# Patient Record
Sex: Male | Born: 1975 | Race: White | Hispanic: No | Marital: Single | Smoking: Never smoker
Health system: Southern US, Community
[De-identification: ages and names within clinical notes are randomized; demographics above are authoritative.]

---

## 2000-11-05 ENCOUNTER — Emergency Department (HOSPITAL_COMMUNITY): Admission: EM | Admit: 2000-11-05 | Discharge: 2000-11-05 | Payer: Self-pay | Admitting: Emergency Medicine

## 2000-11-05 ENCOUNTER — Encounter: Payer: Self-pay | Admitting: Emergency Medicine

## 2000-11-18 ENCOUNTER — Emergency Department (HOSPITAL_COMMUNITY): Admission: EM | Admit: 2000-11-18 | Discharge: 2000-11-18 | Payer: Self-pay

## 2005-01-03 ENCOUNTER — Emergency Department (HOSPITAL_COMMUNITY): Admission: EM | Admit: 2005-01-03 | Discharge: 2005-01-03 | Payer: Self-pay | Admitting: Emergency Medicine

## 2010-04-01 ENCOUNTER — Telehealth: Payer: Self-pay | Admitting: *Deleted

## 2010-12-28 NOTE — Progress Notes (Signed)
Summary: New pt No Show in Jan, calling to schedule again  ---- Converted from flag ---- ---- 03/31/2010 5:35 PM, Evelena Peat MD wrote:   ---- 03/31/2010 1:49 PM, Lucy Antigua wrote: I rcvd call from a Roger Norris dob [birth year]. Pt NOS for NP appt in January and now wants to sch ov. Pt has dry cough for about 3 wks and is getting progressively worse.  Please advise.    [phone] Roger Norris) ------------------------------  Phone Note Call from Patient   Caller: Roger Norris [phone] Call For: [place] Summary of Call: Per Dr Caryl Never, call pt to discuss reason for no-show in Jan, 2011.  Home phone on record [phone]  VM name was Gretchen Short?  Called number Asher Muir (fiance) left [phone] "not in service", not able to leave a message. Initial call taken by: Sid Falcon LPN,  Apr 01, 3328 9:24 AM

## 2014-07-29 ENCOUNTER — Ambulatory Visit (INDEPENDENT_AMBULATORY_CARE_PROVIDER_SITE_OTHER): Payer: Managed Care, Other (non HMO) | Admitting: Podiatrist

## 2014-07-29 ENCOUNTER — Encounter: Payer: Self-pay | Admitting: Podiatrist

## 2014-07-29 ENCOUNTER — Ambulatory Visit (INDEPENDENT_AMBULATORY_CARE_PROVIDER_SITE_OTHER): Payer: Managed Care, Other (non HMO)

## 2014-07-29 VITALS — BP 117/82 | HR 94 | Resp 18

## 2014-07-29 DIAGNOSIS — R52 Pain, unspecified: Secondary | ICD-10-CM

## 2014-07-29 DIAGNOSIS — M722 Plantar fascial fibromatosis: Secondary | ICD-10-CM

## 2014-07-29 NOTE — Progress Notes (Signed)
   Subjective:    Patient ID: Roger Norris, male    DOB: 1976-09-10, 38 y.o.   MRN: [id number]  HPI I AM HAVING SOME PAIN IN MY RIGHT HEEL AND HAS BEEN GOING ON FOR ABOUT 2 YEARS AND THERE IS NO BURNING AND THROBBING AND FEELS BRUISED AND IT HURTS IN THE HEEL AND HURTS IN THE MORNING AND HURTS AFTER I SIT AND I SOMETIMES ROLL MY ANKLE AND I WENT TO A FOOT DOCTOR A YEAR AGO AND HE GAVE TWO BRACES ONE FOR NIGHT TIME AND ONE TO WEAR DURING THE DAY AND WALKS ALL DAY AT WORK    Review of Systems  Musculoskeletal: Positive for gait problem.  All other systems reviewed and are negative.      Objective:   Physical Exam  GENERAL APPEARANCE: Alert, conversant. Appropriately groomed. No acute distress.  VASCULAR: Pedal pulses palpable and strong bilateral.  Capillary refill time is immediate to all digits,  Proximal to distal cooling it warm to warm.  Digital hair growth is present bilateral  NEUROLOGIC: sensation is intact epicritically and protectively to 5.07 monofilament at 5/5 sites bilateral.  Light touch is intact bilateral, vibratory sensation intact bilateral, achilles tendon reflex is intact bilateral.  Negative Tinel sign is elicited MUSCULOSKELETAL: Pain on palpation plantar medial aspect right foot  at insertion of plantar fascia on the medial calcaneal tubercle. Inflammation at the insertion of the plantar fascia is present. Rectus foot type is seen. DERMATOLOGIC: skin color, texture, and turger are within normal limits.  No preulcerative lesions are seen, no interdigital maceration noted.  No open lesions present.  Digital nails are asymptomatic.          Assessment & Plan:  Plantar fasciitis right foot  Plan:  The patient was disappointed with the relief he received from a previous injection from a previous doctor therefore he declined an injection at today's visit.  Recommended plantar fascial taping and an orthotic-- dispensed a power step insert today.  Recommended stretching  exercises and use of good supportive shoes.  He will be seen back in the future or if he reconsiders a steroid injection.

## 2014-07-29 NOTE — Patient Instructions (Signed)

## 2014-08-27 ENCOUNTER — Ambulatory Visit: Payer: Managed Care, Other (non HMO) | Admitting: Podiatrist

## 2017-10-26 ENCOUNTER — Ambulatory Visit (INDEPENDENT_AMBULATORY_CARE_PROVIDER_SITE_OTHER): Payer: Worker's Compensation | Admitting: Orthopedic Surgery

## 2017-10-26 ENCOUNTER — Ambulatory Visit (INDEPENDENT_AMBULATORY_CARE_PROVIDER_SITE_OTHER): Payer: Worker's Compensation

## 2017-10-26 ENCOUNTER — Other Ambulatory Visit (INDEPENDENT_AMBULATORY_CARE_PROVIDER_SITE_OTHER): Payer: Self-pay

## 2017-10-26 DIAGNOSIS — M25511 Pain in right shoulder: Secondary | ICD-10-CM

## 2017-10-26 DIAGNOSIS — G8929 Other chronic pain: Secondary | ICD-10-CM

## 2017-10-29 ENCOUNTER — Encounter (INDEPENDENT_AMBULATORY_CARE_PROVIDER_SITE_OTHER): Payer: Self-pay | Admitting: Orthopedic Surgery

## 2017-10-29 NOTE — Progress Notes (Signed)
Office Visit Note   Patient: Roger Norris           Date of Birth: 07-Apr-1976           MRN: [id number] Visit Date: 10/26/2017 Requested by: No referring provider defined for this encounter. PCP: Patient, No Pcp Per  Subjective: No chief complaint on file.   HPI: Roger Norris is a 41-year-old patient who injured his right shoulder states that he tried to keep battery from falling off the shelf caught it with his arm flexed.  Reported pain in the shoulder neck and elbow region since that time.  States that his strength and range of motion.  He has had MRI scan of the neck which shows reoccurred September 17, 2017.  He reports biceps pain as well.  Localizes the pain to the shoulder also.  Out of work since November 18.  Both of his head symptoms are worse.  He has an MRI report of cervical spine which does show some degenerative changes and disc bulging.  This may be part of his symptomatology.  He has not had imaging of his right shoulder.              ROS: All systems reviewed are negative as they relate to the chief complaint within the history of present illness.  Patient denies  fevers or chills.   Assessment & Plan: Visit Diagnoses:  1. Acute pain of right shoulder     Plan: Impression is right shoulder and neck pain.  Right elbow examination today demonstrates that the biceps tendon distally is intact.  His mechanism of injury is most consistent with biceps injury but functionally that appears to be intact today.  No paresthesias in the lateral antebrachial cutaneous nerve as well.  I think it is likely he has neck pathology which is giving him his whole arm symptoms and shoulder pathology must also be ruled out based on mechanism of injury.  Shoulder to evaluate the labrum and rotator cuff tear refer to Dr. Alvester MorinNewton for cervical spine ESI for diagnostic and therapeutic purposes.  Out of work for 4 weeks.  Follow-Up Instructions: Return for after MRI.   Orders:  Orders Placed This Encounter    Procedures  . XR Shoulder Right   No orders of the defined types were placed in this encounter.     Procedures: No procedures performed   Clinical Data: No additional findings.  Objective: Vital Signs: There were no vitals taken for this visit.  Physical Exam:   Constitutional: Patient appears well-developed HEENT:  Head: Normocephalic Eyes:EOM are normal Neck: Normal range of motion Cardiovascular: Normal rate Pulmonary/chest: Effort normal Neurologic: Patient is alert Skin: Skin is warm Psychiatric: Patient has normal mood and affect    Ortho Exam: Orthopedic exam demonstrates good cervical spine range of motion.  Patient has 5 out of 5 grip EPL FPL interosseous wrist flexion wrist extension biceps triceps and deltoid strength.  Rotator cuff strength also pretty reasonable to infraspinatus supraspinatus and subscap testing on the right compared to the left.  No discrete acromioclavicular joint tenderness on the right-hand side.  Negative apprehension and relocation testing on the right.  No definite paresthesias C5-T1.  Reflexes symmetric bilateral biceps and triceps.  Specialty Comments:  No specialty comments available.  Imaging: No results found.   PMFS History: There are no active problems to display for this patient.  No past medical history on file.  No family history on file.   Social History  Occupational History  . Not on file  Tobacco Use  . Smoking status: Never Smoker  . Smokeless tobacco: Never Used  Substance and Sexual Activity  . Alcohol use: Yes  . Drug use: No  . Sexual activity: Not on file

## 2017-11-02 ENCOUNTER — Telehealth (INDEPENDENT_AMBULATORY_CARE_PROVIDER_SITE_OTHER): Payer: Self-pay

## 2017-11-02 NOTE — Telephone Encounter (Signed)
Emailed case mgr the 10/25/17 work note per her request

## 2017-11-09 ENCOUNTER — Telehealth (INDEPENDENT_AMBULATORY_CARE_PROVIDER_SITE_OTHER): Payer: Self-pay | Admitting: *Deleted

## 2017-11-09 NOTE — Telephone Encounter (Signed)
I received a vm from [place] at [facility] stating needs order MRI right shoulder faxed to [phone] in order to get pt scheduled for MRI. I faxed over the order as requested.

## 2017-12-13 ENCOUNTER — Ambulatory Visit (INDEPENDENT_AMBULATORY_CARE_PROVIDER_SITE_OTHER): Payer: Self-pay | Admitting: Orthopedic Surgery

## 2017-12-15 ENCOUNTER — Ambulatory Visit (INDEPENDENT_AMBULATORY_CARE_PROVIDER_SITE_OTHER): Payer: Worker's Compensation | Admitting: Orthopedic Surgery

## 2017-12-15 ENCOUNTER — Encounter (INDEPENDENT_AMBULATORY_CARE_PROVIDER_SITE_OTHER): Payer: Self-pay | Admitting: Orthopedic Surgery

## 2017-12-15 DIAGNOSIS — M19011 Primary osteoarthritis, right shoulder: Secondary | ICD-10-CM | POA: Diagnosis not present

## 2017-12-15 DIAGNOSIS — M7551 Bursitis of right shoulder: Secondary | ICD-10-CM

## 2017-12-16 ENCOUNTER — Encounter (INDEPENDENT_AMBULATORY_CARE_PROVIDER_SITE_OTHER): Payer: Self-pay | Admitting: Orthopedic Surgery

## 2017-12-16 DIAGNOSIS — M19011 Primary osteoarthritis, right shoulder: Secondary | ICD-10-CM | POA: Diagnosis not present

## 2017-12-16 DIAGNOSIS — M7551 Bursitis of right shoulder: Secondary | ICD-10-CM | POA: Diagnosis not present

## 2017-12-16 MED ORDER — METHYLPREDNISOLONE ACETATE 40 MG/ML IJ SUSP
13.3300 mg | INTRAMUSCULAR | Status: AC | PRN
  Administered 2017-12-16: 13.33 mg via INTRA_ARTICULAR

## 2017-12-16 MED ORDER — BUPIVACAINE HCL 0.25 % IJ SOLN
0.6600 mL | INTRAMUSCULAR | Status: AC | PRN
  Administered 2017-12-16: .66 mL via INTRA_ARTICULAR

## 2017-12-16 MED ORDER — METHYLPREDNISOLONE ACETATE 40 MG/ML IJ SUSP
40.0000 mg | INTRAMUSCULAR | Status: AC | PRN
  Administered 2017-12-16: 40 mg via INTRA_ARTICULAR

## 2017-12-16 MED ORDER — LIDOCAINE HCL 1 % IJ SOLN
5.0000 mL | INTRAMUSCULAR | Status: AC | PRN
  Administered 2017-12-16: 5 mL

## 2017-12-16 MED ORDER — BUPIVACAINE HCL 0.5 % IJ SOLN
9.0000 mL | INTRAMUSCULAR | Status: AC | PRN
  Administered 2017-12-16: 9 mL via INTRA_ARTICULAR

## 2017-12-16 MED ORDER — LIDOCAINE HCL 1 % IJ SOLN
3.0000 mL | INTRAMUSCULAR | Status: AC | PRN
  Administered 2017-12-16: 3 mL

## 2017-12-16 NOTE — Progress Notes (Addendum)
Office Visit Note   Patient: Roger Norris           Date of Birth: 01-06-1976           MRN: [id number] Visit Date: 12/15/2017 Requested by: No referring provider defined for this encounter. PCP: Patient, No Pcp Per  Subjective: Chief Complaint  Patient presents with  . Right Shoulder - Follow-up    HPI: Roger Norris is a patient here with right shoulder pain for review of MRI scan.  MRI scan shows that the rotator cuff is intact.  The labrum also appears intact.  Bursitis and AC joint arthritis is present.  He reports continued symptoms.  Localizes the pain to the superior aspect of the shoulder and the deltoid.              ROS: All systems reviewed are negative as they relate to the chief complaint within the history of present illness.  Patient denies  fevers or chills.   Assessment & Plan: Visit Diagnoses:  1. Arthritis of right acromioclavicular joint     Plan: Impression is right shoulder pain with AC joint arthritis and bursitis.  Rotator cuff looks to be intact.  I would like to inject the [facility] joint today with ultrasound as well as inject the subacromial space.  3-week return for clinical reassessment.  Follow-Up Instructions: Return in about 3 weeks (around 01/05/2018).   Orders:  No orders of the defined types were placed in this encounter.  No orders of the defined types were placed in this encounter.     Procedures: Large Joint Inj: R subacromial bursa on 12/16/2017 9:26 AM Indications: diagnostic evaluation and pain Details: 18 G 1.5 in needle, posterior approach  Arthrogram: No  Medications: 9 mL bupivacaine 0.5 %; 40 mg methylPREDNISolone acetate 40 MG/ML; 5 mL lidocaine 1 % Outcome: tolerated well, no immediate complications Procedure, treatment alternatives, risks and benefits explained, specific risks discussed. Consent was given by the patient. Immediately prior to procedure a time out was called to verify the correct patient, procedure, equipment, support  staff and site/side marked as required. Patient was prepped and draped in the usual sterile fashion.   Medium Joint Inj on 12/16/2017 9:26 AM Indications: diagnostic evaluation and pain Details: 25 G 1.5 in needle, ultrasound-guided superior approach Medications: 3 mL lidocaine 1 %; 0.66 mL bupivacaine 0.25 %; 13.33 mg methylPREDNISolone acetate 40 MG/ML Outcome: tolerated well, no immediate complications Procedure, treatment alternatives, risks and benefits explained, specific risks discussed. Consent was given by the patient. Immediately prior to procedure a time out was called to verify the correct patient, procedure, equipment, support staff and site/side marked as required. Patient was prepped and draped in the usual sterile fashion.       Clinical Data: No additional findings.  Objective: Vital Signs: There were no vitals taken for this visit.  Physical Exam:   Constitutional: Patient appears well-developed HEENT:  Head: Normocephalic Eyes:EOM are normal Neck: Normal range of motion Cardiovascular: Normal rate Pulmonary/chest: Effort normal Neurologic: Patient is alert Skin: Skin is warm Psychiatric: Patient has normal mood and affect    Ortho Exam: Orthopedic exam demonstrates good cervical spine range of motion.  Does have mild AC joint tenderness right versus left.  Motor sensory function to the hand intact.  No restriction of external rotation at 15 degrees of abduction.  No other masses lymphadenopathy or skin changes noted in the right shoulder girdle region  Specialty Comments:  No specialty comments available.  Imaging: No  results found.   PMFS History: There are no active problems to display for this patient.  History reviewed. No pertinent past medical history.  History reviewed. No pertinent family history.  History reviewed. No pertinent surgical history. Social History   Occupational History  . Not on file  Tobacco Use  . Smoking status: Never  Smoker  . Smokeless tobacco: Never Used  Substance and Sexual Activity  . Alcohol use: Yes  . Drug use: No  . Sexual activity: Not on file

## 2017-12-19 ENCOUNTER — Telehealth (INDEPENDENT_AMBULATORY_CARE_PROVIDER_SITE_OTHER): Payer: Self-pay

## 2017-12-19 NOTE — Telephone Encounter (Signed)
Faxed the 10/05/18 office note to case mgr per her request 

## 2017-12-20 ENCOUNTER — Telehealth (INDEPENDENT_AMBULATORY_CARE_PROVIDER_SITE_OTHER): Payer: Self-pay

## 2017-12-20 DIAGNOSIS — Z7689 Persons encountering health services in other specified circumstances: Secondary | ICD-10-CM

## 2017-12-20 NOTE — Telephone Encounter (Signed)
Please advise. Nothing mentioned about work status in last OV.

## 2017-12-20 NOTE — Telephone Encounter (Signed)
wc case mgr needs updated work note for this patient faxed to her @ [phone]

## 2017-12-21 NOTE — Telephone Encounter (Signed)
I have written new note. He still wants him to see Dr [facility].

## 2017-12-21 NOTE — Telephone Encounter (Signed)
Please refer to Dr. Alvester MorinNewton and keep him out of work until his return office visit on the 13th thanks

## 2017-12-25 NOTE — Telephone Encounter (Signed)
done

## 2017-12-25 NOTE — Telephone Encounter (Signed)
Emailed work note and order for Dr. Alvester MorinNewton to [facility] [email or web address] ([email or web address])

## 2017-12-25 NOTE — Telephone Encounter (Signed)
Emailed work note and order for Dr. Newton to Denise Browsky @ [email or web address] ([email or web address]) 

## 2017-12-25 NOTE — Addendum Note (Signed)
Addended byPrescott Parma: Rolf Fells on: 12/25/2017 09:48 AM   Modules accepted: Orders

## 2018-01-10 ENCOUNTER — Encounter (INDEPENDENT_AMBULATORY_CARE_PROVIDER_SITE_OTHER): Payer: Self-pay | Admitting: Orthopedic Surgery

## 2018-01-10 ENCOUNTER — Ambulatory Visit (INDEPENDENT_AMBULATORY_CARE_PROVIDER_SITE_OTHER): Payer: Worker's Compensation | Admitting: Orthopedic Surgery

## 2018-01-10 DIAGNOSIS — M25521 Pain in right elbow: Secondary | ICD-10-CM | POA: Diagnosis not present

## 2018-01-13 ENCOUNTER — Encounter (INDEPENDENT_AMBULATORY_CARE_PROVIDER_SITE_OTHER): Payer: Self-pay | Admitting: Orthopedic Surgery

## 2018-01-13 NOTE — Progress Notes (Signed)
   Office Visit Note   Patient: Roger Norris           Date of Birth: [birth year]           MRN: [id number] Visit Date: 01/10/2018 Requested by: No referring provider defined for this encounter. PCP: Patient, No Pcp Per  Subjective: Chief Complaint  Patient presents with  . Right Shoulder - Follow-up    HPI: Roger Norris is a patient with right shoulder pain and right elbow pain.Marland Kitchen.  He got good relief in the shoulder but he still reports a lot of pain in the elbow.  He had an eccentric load to the elbow approximately 6 weeks ago.  It has remained painful.  It interferes with his ADLs as well as his work.              ROS: All systems reviewed are negative as they relate to the chief complaint within the history of present illness.  Patient denies  fevers or chills.   Assessment & Plan: Visit Diagnoses:  1. Right elbow pain     Plan: Impression is right AC joint doing well with injection.  Patient has persistent right elbow pain.  He does have functional supination and flexion.  He does not have medial or lateral sided symptoms this likely represents some degree of distal biceps tearing.  Plan MRI scan to evaluate the amount of distal biceps tearing.  Follow-Up Instructions: Return for after MRI.   Orders:  Orders Placed This Encounter  Procedures  . MR Elbow Right w/o contrast   No orders of the defined types were placed in this encounter.     Procedures: No procedures performed   Clinical Data: No additional findings.  Objective: Vital Signs: There were no vitals taken for this visit.  Physical Exam:   Constitutional: Patient appears well-developed HEENT:  Head: Normocephalic Eyes:EOM are normal Neck: Normal range of motion Cardiovascular: Normal rate Pulmonary/chest: Effort normal Neurologic: Patient is alert Skin: Skin is warm Psychiatric: Patient has normal mood and affect    Ortho Exam: Orthopedic exam demonstrates no pain with right shoulder manipulation or  palpation particularly around the [facility].  Around the right elbow there is no medial or lateral condyle tenderness.  Ulnar nerve does not subluxate.  Does have tenderness anteriorly around the biceps tendon.  Motor sensory function to the hand is intact.  Radial artery is intact.  No other masses lymphadenopathy or skin changes noted in the right elbow region.  Pronation supination flexion extension range of motion all full.  Specialty Comments:  No specialty comments available.  Imaging: No results found.   PMFS History: There are no active problems to display for this patient.  History reviewed. No pertinent past medical history.  History reviewed. No pertinent family history.  History reviewed. No pertinent surgical history. Social History   Occupational History  . Not on file  Tobacco Use  . Smoking status: Never Smoker  . Smokeless tobacco: Never Used  Substance and Sexual Activity  . Alcohol use: Yes  . Drug use: No  . Sexual activity: Not on file

## 2018-02-07 ENCOUNTER — Ambulatory Visit (INDEPENDENT_AMBULATORY_CARE_PROVIDER_SITE_OTHER): Payer: Worker's Compensation | Admitting: Orthopedic Surgery

## 2018-02-07 ENCOUNTER — Encounter (INDEPENDENT_AMBULATORY_CARE_PROVIDER_SITE_OTHER): Payer: Self-pay | Admitting: Orthopedic Surgery

## 2018-02-07 ENCOUNTER — Telehealth (INDEPENDENT_AMBULATORY_CARE_PROVIDER_SITE_OTHER): Payer: Self-pay

## 2018-02-07 DIAGNOSIS — M25521 Pain in right elbow: Secondary | ICD-10-CM | POA: Diagnosis not present

## 2018-02-07 NOTE — Telephone Encounter (Signed)
Emailed todays office note and work note to [employer] Brow[employer] per her request

## 2018-02-07 NOTE — Progress Notes (Signed)
   Office Visit Note   Patient: Roger Norris           Date of Birth: Mar 29, 1976           MRN: [id number] Visit Date: 02/07/2018 Requested by: No referring provider defined for this encounter. PCP: Patient, No Pcp Per  Subjective: Chief Complaint  Patient presents with  . Right Elbow - Follow-up    HPI: Roger Norris is a patient with right elbow pain.  Since I have last seen him he has had an MRI scan of that elbow.  He does not have a distal biceps tendon rupture or partial tear.  He does have a sprain with some acute tendinitis.  He is gradually improving.  He has had some acupuncture with a therapist which is helped his arm symptoms some.              ROS: All systems reviewed are negative as they relate to the chief complaint within the history of present illness.  Patient denies  fevers or chills.   Assessment & Plan: Visit Diagnoses: No diagnosis found.  Plan: Impression is acute strain right distal biceps tendon without evidence of partial or full-thickness tear.  His mechanism was good for a distal biceps tear but that is not present.  I reviewed the scan with him.  Reviewed the images.  Plan is to continue the therapy that he is been doing.  Continue over-the-counter anti-inflammatories.  Okay to return to work regular duty in 1 week.  Follow-up with me as needed.  Follow-Up Instructions: Return if symptoms worsen or fail to improve.   Orders:  No orders of the defined types were placed in this encounter.  No orders of the defined types were placed in this encounter.     Procedures: No procedures performed   Clinical Data: No additional findings.  Objective: Vital Signs: There were no vitals taken for this visit.  Physical Exam:   Constitutional: Patient appears well-developed HEENT:  Head: Normocephalic Eyes:EOM are normal Neck: Normal range of motion Cardiovascular: Normal rate Pulmonary/chest: Effort normal Neurologic: Patient is alert Skin: Skin is  warm Psychiatric: Patient has normal mood and affect    Ortho Exam: Orthopedic exam is unchanged on the right elbow with good range of motion palpable biceps tendon which is mildly tender rest of the exam is unchanged  Specialty Comments:  No specialty comments available.  Imaging: No results found.   PMFS History: There are no active problems to display for this patient.  History reviewed. No pertinent past medical history.  History reviewed. No pertinent family history.  History reviewed. No pertinent surgical history. Social History   Occupational History  . Not on file  Tobacco Use  . Smoking status: Never Smoker  . Smokeless tobacco: Never Used  Substance and Sexual Activity  . Alcohol use: Yes  . Drug use: No  . Sexual activity: Not on file

## 2018-02-12 ENCOUNTER — Telehealth (INDEPENDENT_AMBULATORY_CARE_PROVIDER_SITE_OTHER): Payer: Self-pay

## 2018-02-12 NOTE — Telephone Encounter (Signed)
Corrected and faxed back

## 2018-02-12 NOTE — Telephone Encounter (Signed)
pts  employer is asking that the work status letter be more specific and state that he may return to work on 3/20 - full duty, no restrictions.

## 2019-11-11 ENCOUNTER — Other Ambulatory Visit: Payer: Self-pay | Admitting: Family Medicine

## 2019-11-11 DIAGNOSIS — R1319 Other dysphagia: Secondary | ICD-10-CM

## 2019-11-11 DIAGNOSIS — R1310 Dysphagia, unspecified: Secondary | ICD-10-CM

## 2019-12-05 ENCOUNTER — Ambulatory Visit
Admission: RE | Admit: 2019-12-05 | Discharge: 2019-12-05 | Disposition: A | Discharge status: Home / Self Care | Payer: Medicaid Other | Source: Ambulatory Visit | Attending: Family Medicine | Admitting: Family Medicine

## 2019-12-05 DIAGNOSIS — R1310 Dysphagia, unspecified: Secondary | ICD-10-CM

## 2019-12-05 DIAGNOSIS — R1319 Other dysphagia: Secondary | ICD-10-CM

## 2020-07-03 IMAGING — RF DG ESOPHAGUS
11 series · 14 of 24 positions shown · non-contrast
Comparison: None.

CLINICAL DATA: 43-year-old male with intermittent feeling of throat
closing when swallowing, sometimes food not going down normally.

EXAM:
ESOPHOGRAM / BARIUM SWALLOW / BARIUM TABLET STUDY
TECHNIQUE: Combined double contrast and single contrast examination performed
using effervescent crystals, thick barium liquid, and thin barium
liquid. The patient was observed with fluoroscopy swallowing a 13 mm
barium sulphate tablet.
FLUOROSCOPY TIME:  Fluoroscopy Time:  2 minutes 6 seconds
Radiation Exposure Index (if provided by the fluoroscopic device):
278 mGy
Number of Acquired Spot Images: 0

[Series 1: sequence · 2 of 19 frames shown (1 of 9)]
[frame 3/19]
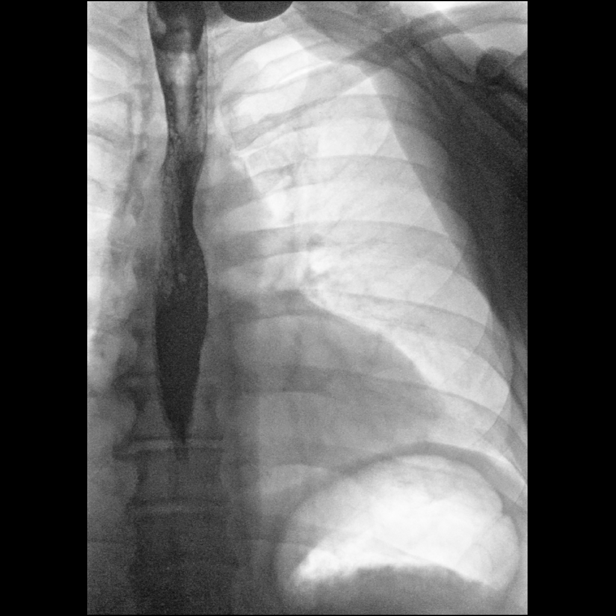
[frame 19/19]
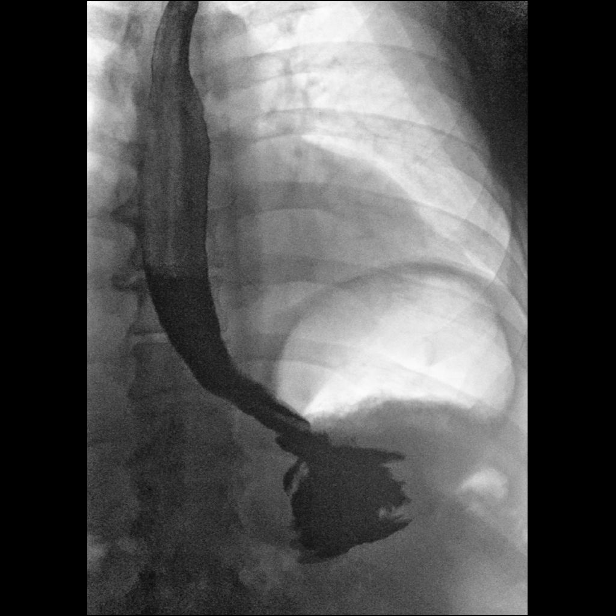

[Series 2: sequence · 1 of 23 frames shown (2 of 9)]
[frame 18/23]
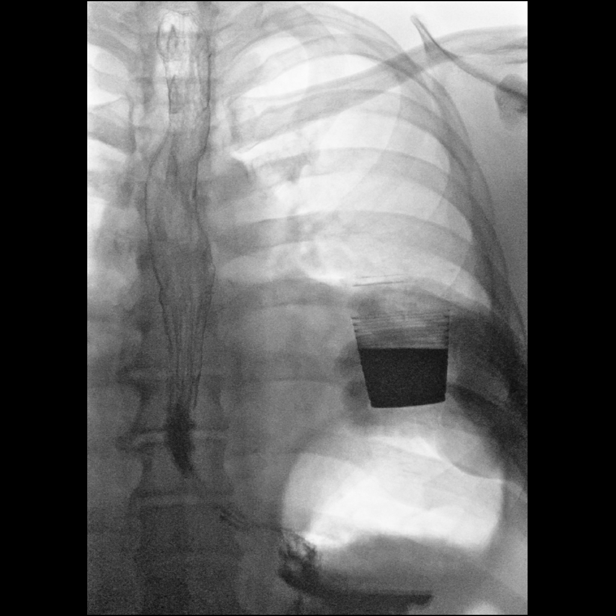

[Series 3: sequence · 2 of 18 frames shown (3 of 9)]
[frame 10/18]
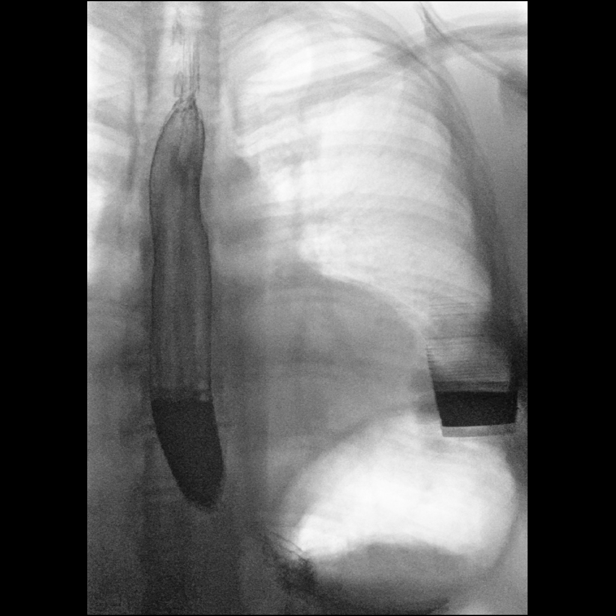
[frame 17/18]
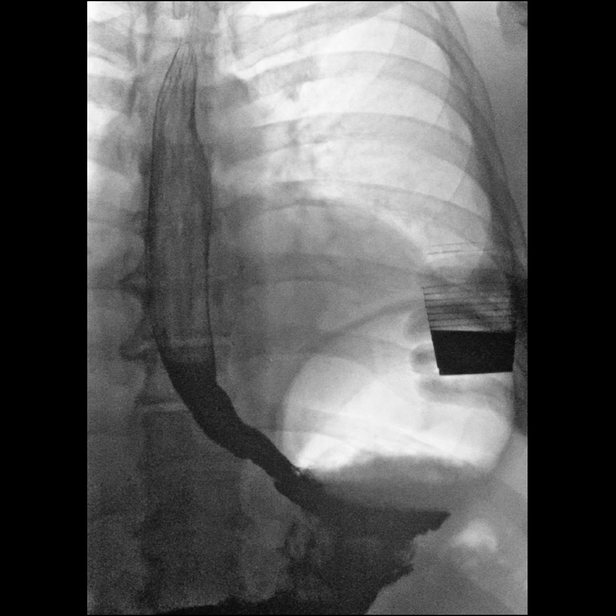

[Series 4: sequence · 1 of 11 frames shown (4 of 9)]
[frame 10/11]
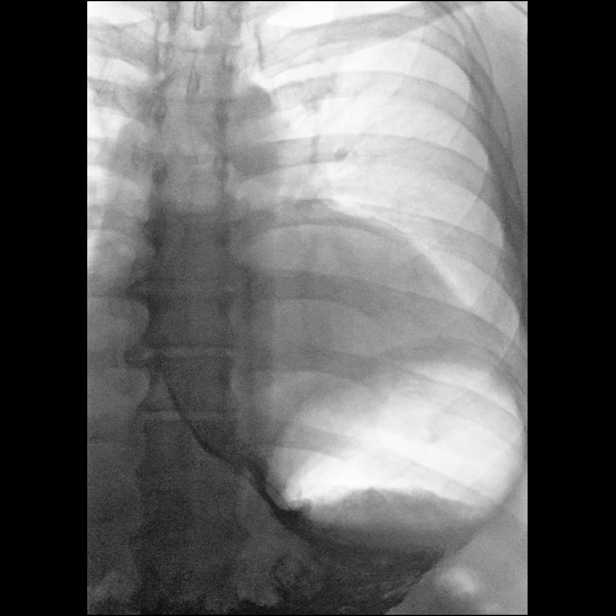

[Series 5: sequence · 1 of 30 frames shown (5 of 9)]
[frame 18/30]
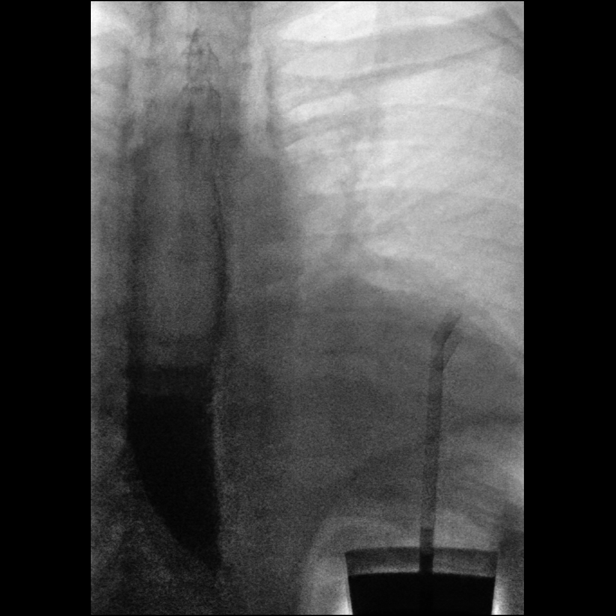

[Series 6: sequence · 2 of 11 frames shown (6 of 9)]
[frame 2/11]
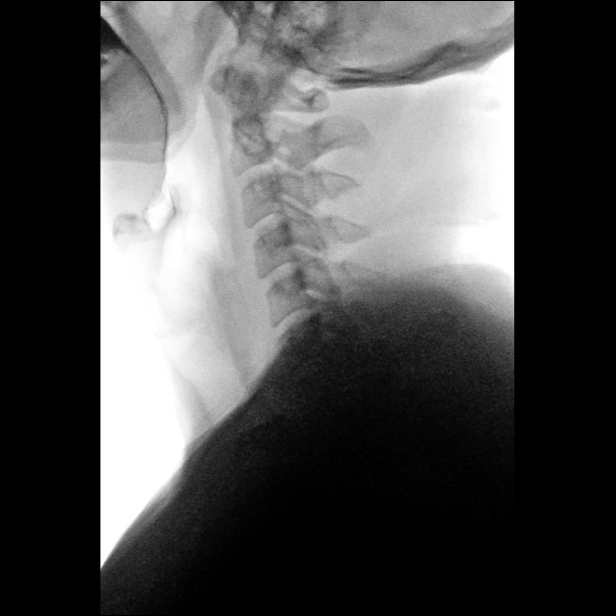
[frame 10/11]
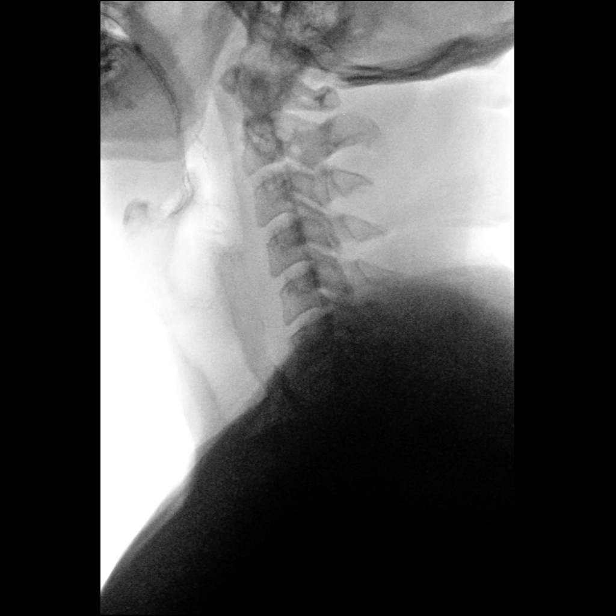

[Series 7: sequence · 1 of 44 frames shown (7 of 9)]
[frame 37/44]
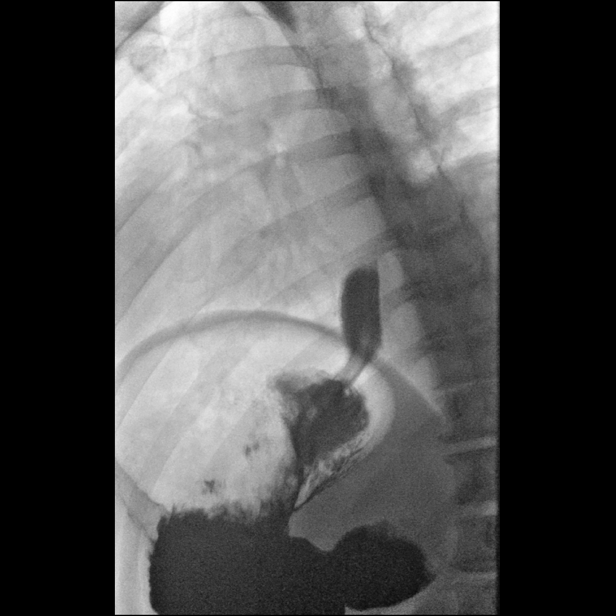

[Series 8: sequence · 1 of 21 frames shown (8 of 9)]
[frame 11/21]
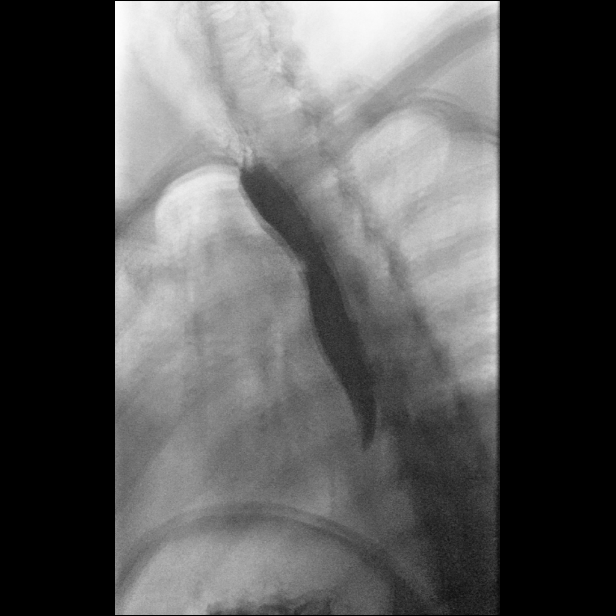

[Series 9: one shot · 1 of 2 slices shown (1 of 2)]
[im 1/2]
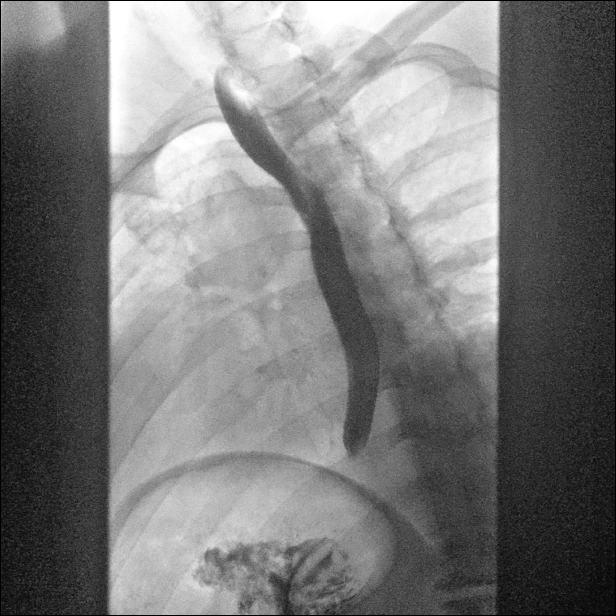

[Series 10: sequence · 1 of 8 frames shown (9 of 9)]
[frame 5/8]
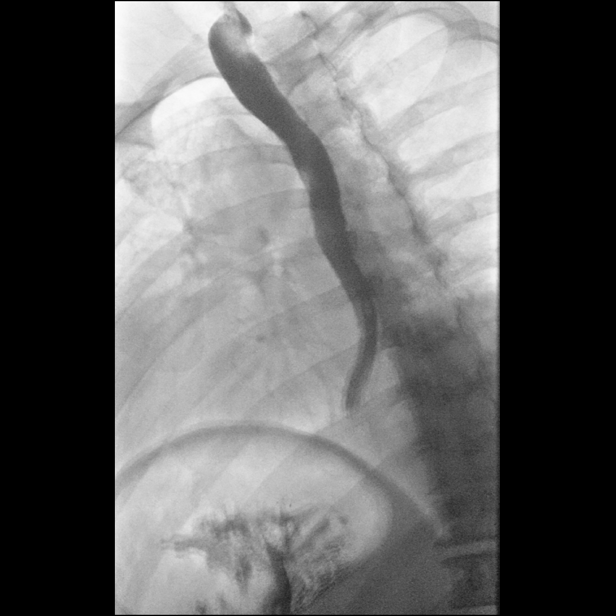

[Series 11: one shot · 1 of 1 slices shown (2 of 2)]
[im 1/1]
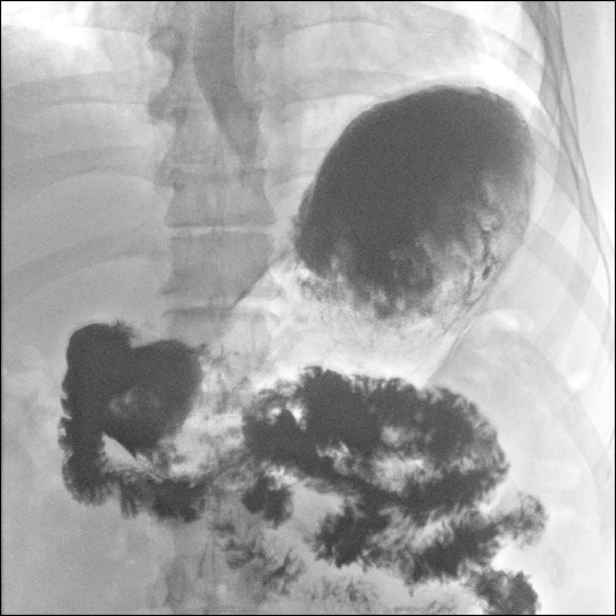

[14 of 24 positions shown; findings below may reference images not displayed]

FINDINGS: A double contrast study was undertaken and the patient tolerated
this well and without difficulty.

No obstruction to the forward flow of contrast throughout the
esophagus and into the stomach. Normal esophageal course and
contour. Normal esophageal mucosal pattern.

With upright swallows there seem to be decreased motility of the
proximal esophagus (series 2), and some tertiary contractions were
also observed (series 3, image 3).

A 12.5 mm barium tablet was administered and passed freely to the
stomach without delay.

Dedicated imaging of the cervical esophagus is normal.

With prone swallows a proximal release of contrast occurred. And
some contrast stasis was noted (series 8 and series 9). No tertiary
contractions occurred while prone. The gastroesophageal junction is
normal.

No gastroesophageal reflux occurred spontaneously or was elicited.
IMPRESSION: 1. Esophageal motility is decreased for age, and occasional abnormal
tertiary contractions occurred.
2. Otherwise normal esophagram.

## 2024-01-22 DIAGNOSIS — S335XXA Sprain of ligaments of lumbar spine, initial encounter: Secondary | ICD-10-CM | POA: Diagnosis not present

## 2024-01-22 DIAGNOSIS — M9901 Segmental and somatic dysfunction of cervical region: Secondary | ICD-10-CM | POA: Diagnosis not present

## 2024-01-22 DIAGNOSIS — M62830 Muscle spasm of back: Secondary | ICD-10-CM | POA: Diagnosis not present

## 2024-01-22 DIAGNOSIS — S134XXA Sprain of ligaments of cervical spine, initial encounter: Secondary | ICD-10-CM | POA: Diagnosis not present

## 2024-01-26 DIAGNOSIS — Z125 Encounter for screening for malignant neoplasm of prostate: Secondary | ICD-10-CM | POA: Diagnosis not present

## 2024-01-26 DIAGNOSIS — Z0000 Encounter for general adult medical examination without abnormal findings: Secondary | ICD-10-CM | POA: Diagnosis not present

## 2024-02-08 DIAGNOSIS — H33002 Unspecified retinal detachment with retinal break, left eye: Secondary | ICD-10-CM | POA: Diagnosis not present

## 2024-04-08 DIAGNOSIS — M62830 Muscle spasm of back: Secondary | ICD-10-CM | POA: Diagnosis not present

## 2024-04-08 DIAGNOSIS — M9901 Segmental and somatic dysfunction of cervical region: Secondary | ICD-10-CM | POA: Diagnosis not present

## 2024-04-08 DIAGNOSIS — S134XXA Sprain of ligaments of cervical spine, initial encounter: Secondary | ICD-10-CM | POA: Diagnosis not present

## 2024-04-08 DIAGNOSIS — S335XXA Sprain of ligaments of lumbar spine, initial encounter: Secondary | ICD-10-CM | POA: Diagnosis not present

## 2024-04-09 DIAGNOSIS — H33022 Retinal detachment with multiple breaks, left eye: Secondary | ICD-10-CM | POA: Diagnosis not present

## 2024-04-09 DIAGNOSIS — H35412 Lattice degeneration of retina, left eye: Secondary | ICD-10-CM | POA: Diagnosis not present

## 2024-04-24 DIAGNOSIS — H3322 Serous retinal detachment, left eye: Secondary | ICD-10-CM | POA: Diagnosis not present

## 2024-05-10 DIAGNOSIS — D751 Secondary polycythemia: Secondary | ICD-10-CM | POA: Diagnosis not present

## 2024-05-10 DIAGNOSIS — H3322 Serous retinal detachment, left eye: Secondary | ICD-10-CM | POA: Diagnosis not present

## 2024-05-28 DIAGNOSIS — H3322 Serous retinal detachment, left eye: Secondary | ICD-10-CM | POA: Diagnosis not present

## 2024-06-27 DIAGNOSIS — H33002 Unspecified retinal detachment with retinal break, left eye: Secondary | ICD-10-CM | POA: Diagnosis not present
